# Patient Record
Sex: Male | Born: 1955 | Race: White | Hispanic: No | Marital: Married
Health system: Midwestern US, Academic
[De-identification: ages and names within clinical notes are randomized; demographics above are authoritative.]

---

## 2018-03-11 ENCOUNTER — Encounter: Admit: 2018-03-11 | Discharge: 2018-03-11

## 2018-03-14 ENCOUNTER — Encounter: Admit: 2018-03-14 | Discharge: 2018-03-14

## 2018-03-14 DIAGNOSIS — I10 Essential (primary) hypertension: ICD-10-CM

## 2018-03-14 DIAGNOSIS — M5136 Other intervertebral disc degeneration, lumbar region: ICD-10-CM

## 2018-03-14 DIAGNOSIS — M4800 Spinal stenosis, site unspecified: ICD-10-CM

## 2018-03-14 DIAGNOSIS — T148XXA Other injury of unspecified body region, initial encounter: ICD-10-CM

## 2018-03-14 DIAGNOSIS — R011 Cardiac murmur, unspecified: ICD-10-CM

## 2018-03-14 DIAGNOSIS — M2550 Pain in unspecified joint: Principal | ICD-10-CM

## 2018-03-14 MED ORDER — GABAPENTIN 300 MG PO CAP
300 mg | ORAL_CAPSULE | ORAL | 1 refills | Status: AC
Start: 2018-03-14 — End: 2018-04-30

## 2018-03-15 ENCOUNTER — Ambulatory Visit: Admit: 2018-03-14 | Discharge: 2018-03-15

## 2018-03-15 ENCOUNTER — Encounter: Admit: 2018-03-15 | Discharge: 2018-03-15

## 2018-03-15 DIAGNOSIS — M4802 Spinal stenosis, cervical region: Principal | ICD-10-CM

## 2018-03-15 DIAGNOSIS — M5412 Radiculopathy, cervical region: ICD-10-CM

## 2018-04-04 ENCOUNTER — Encounter: Admit: 2018-04-04 | Discharge: 2018-04-04

## 2018-04-04 ENCOUNTER — Ambulatory Visit: Admit: 2018-04-04 | Discharge: 2018-04-04

## 2018-04-04 ENCOUNTER — Ambulatory Visit: Admit: 2018-04-04 | Discharge: 2018-04-05

## 2018-04-04 DIAGNOSIS — M5136 Other intervertebral disc degeneration, lumbar region: ICD-10-CM

## 2018-04-04 DIAGNOSIS — R011 Cardiac murmur, unspecified: ICD-10-CM

## 2018-04-04 DIAGNOSIS — Z7982 Long term (current) use of aspirin: ICD-10-CM

## 2018-04-04 DIAGNOSIS — M5412 Radiculopathy, cervical region: ICD-10-CM

## 2018-04-04 DIAGNOSIS — F17290 Nicotine dependence, other tobacco product, uncomplicated: ICD-10-CM

## 2018-04-04 DIAGNOSIS — I10 Essential (primary) hypertension: ICD-10-CM

## 2018-04-04 DIAGNOSIS — M4802 Spinal stenosis, cervical region: Principal | ICD-10-CM

## 2018-04-04 DIAGNOSIS — T148XXA Other injury of unspecified body region, initial encounter: ICD-10-CM

## 2018-04-04 DIAGNOSIS — M4800 Spinal stenosis, site unspecified: ICD-10-CM

## 2018-04-04 DIAGNOSIS — R52 Pain, unspecified: Principal | ICD-10-CM

## 2018-04-04 DIAGNOSIS — M2550 Pain in unspecified joint: Principal | ICD-10-CM

## 2018-04-04 MED ORDER — TRIAMCINOLONE ACETONIDE 40 MG/ML IJ SUSP
80 mg | Freq: Once | EPIDURAL | 0 refills | Status: CP
Start: 2018-04-04 — End: ?
  Administered 2018-04-04: 14:00:00 80 mg via EPIDURAL

## 2018-04-04 MED ORDER — IOPAMIDOL 41 % IT SOLN
2.5 mL | Freq: Once | EPIDURAL | 0 refills | Status: CP
Start: 2018-04-04 — End: ?
  Administered 2018-04-04: 14:00:00 2.5 mL via EPIDURAL

## 2018-04-30 ENCOUNTER — Ambulatory Visit: Admit: 2018-04-30 | Discharge: 2018-04-30

## 2018-04-30 ENCOUNTER — Encounter: Admit: 2018-04-30 | Discharge: 2018-04-30

## 2018-04-30 DIAGNOSIS — I10 Essential (primary) hypertension: ICD-10-CM

## 2018-04-30 DIAGNOSIS — R351 Nocturia: ICD-10-CM

## 2018-04-30 DIAGNOSIS — R972 Elevated prostate specific antigen [PSA]: Principal | ICD-10-CM

## 2018-04-30 DIAGNOSIS — M4800 Spinal stenosis, site unspecified: ICD-10-CM

## 2018-04-30 DIAGNOSIS — M5136 Other intervertebral disc degeneration, lumbar region: ICD-10-CM

## 2018-04-30 DIAGNOSIS — T148XXA Other injury of unspecified body region, initial encounter: ICD-10-CM

## 2018-04-30 DIAGNOSIS — M2550 Pain in unspecified joint: Principal | ICD-10-CM

## 2018-04-30 DIAGNOSIS — R011 Cardiac murmur, unspecified: ICD-10-CM

## 2018-04-30 DIAGNOSIS — N401 Enlarged prostate with lower urinary tract symptoms: Secondary | ICD-10-CM

## 2018-04-30 LAB — PROSTATIC SPECIFIC ANTIGEN-PSA: Lab: 2.5 ng/mL (ref <4.01)

## 2018-05-01 ENCOUNTER — Encounter: Admit: 2018-05-01 | Discharge: 2018-05-01

## 2018-05-05 ENCOUNTER — Encounter: Admit: 2018-05-05 | Discharge: 2018-05-05

## 2018-05-05 DIAGNOSIS — N400 Enlarged prostate without lower urinary tract symptoms: ICD-10-CM

## 2018-05-05 DIAGNOSIS — R011 Cardiac murmur, unspecified: Principal | ICD-10-CM

## 2018-05-05 DIAGNOSIS — S0460XA Injury of acoustic nerve, unspecified side, initial encounter: ICD-10-CM

## 2018-05-05 DIAGNOSIS — I10 Essential (primary) hypertension: ICD-10-CM

## 2018-05-05 DIAGNOSIS — M5136 Other intervertebral disc degeneration, lumbar region: ICD-10-CM

## 2018-05-05 DIAGNOSIS — M4800 Spinal stenosis, site unspecified: ICD-10-CM

## 2018-12-07 ENCOUNTER — Encounter: Admit: 2018-12-07 | Discharge: 2018-12-07

## 2018-12-19 ENCOUNTER — Encounter: Admit: 2018-12-19 | Discharge: 2018-12-19

## 2018-12-19 ENCOUNTER — Ambulatory Visit: Admit: 2018-12-19 | Discharge: 2018-12-20

## 2018-12-19 DIAGNOSIS — I10 Essential (primary) hypertension: ICD-10-CM

## 2018-12-19 DIAGNOSIS — M4800 Spinal stenosis, site unspecified: ICD-10-CM

## 2018-12-19 DIAGNOSIS — M5136 Other intervertebral disc degeneration, lumbar region: ICD-10-CM

## 2018-12-19 DIAGNOSIS — N400 Enlarged prostate without lower urinary tract symptoms: ICD-10-CM

## 2018-12-19 DIAGNOSIS — S0460XA Injury of acoustic nerve, unspecified side, initial encounter: ICD-10-CM

## 2018-12-19 DIAGNOSIS — R011 Cardiac murmur, unspecified: Principal | ICD-10-CM

## 2018-12-19 DIAGNOSIS — M5412 Radiculopathy, cervical region: Principal | ICD-10-CM

## 2018-12-19 NOTE — Progress Notes
???   Smokeless tobacco: Former Neurosurgeon   ??? Tobacco comment: Occasional cigar   Substance and Sexual Activity   ??? Alcohol use: Yes     Alcohol/week: 3.0 standard drinks     Types: 3 Cans of beer per week   ??? Drug use: Never   ??? Sexual activity: Yes     Partners: Female     Birth control/protection: Post-menopausal   Other Topics Concern   ??? Not on file   Social History Narrative   ??? Not on file       Allergies   Allergen Reactions   ??? Prednisone RASH         Current Outpatient Medications:   ???  aspirin EC 81 mg tablet, , Disp: , Rfl:   ???  celecoxib (CELEBREX) 200 mg capsule, , Disp: , Rfl:   ???  fluticasone propionate (FLONASE) 50 mcg/actuation nasal spray, , Disp: , Rfl:   ???  omega 3-dha-epa-fish oil (FISH OIL) 100-160-1,000 mg cap, , Disp: , Rfl:     Vitals:    12/19/18 1215   Weight: 93 kg (205 lb)   Height: 188 cm (74)   PainSc: Three       Oswestry Total Score:: 14    Opioid Risk Category: Low Risk (03/14/2018  1:50 PM)    Is a controlled substance agreement on file?No    Pain Score: Three    Body mass index is 26.32 kg/m???.    Review of Systems   Constitutional: Negative.    HENT: Negative.    Eyes: Negative.    Respiratory: Negative.    Cardiovascular: Negative.    Gastrointestinal: Positive for abdominal pain.   Endocrine: Negative.    Genitourinary: Negative.    Musculoskeletal: Positive for back pain, neck pain and neck stiffness.   Skin: Negative.    Allergic/Immunologic: Negative.    Neurological: Negative.    Hematological: Negative.    Psychiatric/Behavioral: Negative.             PHYSICAL EXAM:    Exam limited due to telehealth  Patient demonstrates full neck range of motion but has pain at the extremes of extension, flexion, and bilateral rotation  Patient demonstrates full range of motion of the bilateral shoulders  Patient demonstrates good grip strength with ability to grasp both a pen and a cup of water in both hands  Gait is normal      RADIOGRAPHIC EVALUATION:

## 2018-12-20 DIAGNOSIS — M4802 Spinal stenosis, cervical region: Secondary | ICD-10-CM

## 2019-01-24 ENCOUNTER — Encounter: Admit: 2019-01-24 | Discharge: 2019-01-24

## 2019-01-24 ENCOUNTER — Ambulatory Visit: Admit: 2019-01-24 | Discharge: 2019-01-24

## 2019-01-24 DIAGNOSIS — N400 Enlarged prostate without lower urinary tract symptoms: Secondary | ICD-10-CM

## 2019-01-24 DIAGNOSIS — M4802 Spinal stenosis, cervical region: Secondary | ICD-10-CM

## 2019-01-24 DIAGNOSIS — M4800 Spinal stenosis, site unspecified: Secondary | ICD-10-CM

## 2019-01-24 DIAGNOSIS — M5136 Other intervertebral disc degeneration, lumbar region: Secondary | ICD-10-CM

## 2019-01-24 DIAGNOSIS — R011 Cardiac murmur, unspecified: Secondary | ICD-10-CM

## 2019-01-24 DIAGNOSIS — M5412 Radiculopathy, cervical region: Secondary | ICD-10-CM

## 2019-01-24 DIAGNOSIS — I10 Essential (primary) hypertension: Secondary | ICD-10-CM

## 2019-01-24 DIAGNOSIS — S0460XA Injury of acoustic nerve, unspecified side, initial encounter: Secondary | ICD-10-CM

## 2019-01-24 MED ORDER — TRIAMCINOLONE ACETONIDE 40 MG/ML IJ SUSP
80 mg | Freq: Once | EPIDURAL | 0 refills | Status: CP
Start: 2019-01-24 — End: ?

## 2019-01-24 MED ORDER — IOHEXOL 240 MG IODINE/ML IV SOLN
2.5 mL | Freq: Once | EPIDURAL | 0 refills | Status: CP
Start: 2019-01-24 — End: ?

## 2019-01-24 MED ORDER — LIDOCAINE (PF) 10 MG/ML (1 %) IJ SOLN
5 mL | Freq: Once | INTRAMUSCULAR | 0 refills | Status: CP
Start: 2019-01-24 — End: ?

## 2019-01-24 NOTE — Procedures
[facility] INJECT INTERLAM CRV/THRC  Procedure: epidural - interlaminar    Laterality: n/a    Location: cervical - C7-T1      Consent:   Consent obtained: written  Consent given by: patient  Risks discussed: allergic reaction, bleeding, infection, nerve damage, no change or worsening in pain and reaction to medication    Discussed with patient the purpose of the treatment/procedure, other ways of treating my condition, including no treatment/ procedure and the risks and benefits of the alternatives. Patient has decided to proceed with treatment/procedure.        Universal Protocol:  Relevant documents: relevant documents present and verified  Test results: test results available and properly labeled  Imaging studies: imaging studies available  Required items: required blood products, implants, devices, and special equipment available  Site marked: the operative site was marked  Patient identity confirmed: Patient identify confirmed verbally with patient.        Time out: Immediately prior to procedure a "time out" was called to verify the correct patient, procedure, equipment, support staff and site/side marked as required      Procedures Details:   Indications: pain   Prep: chlorhexidine  Patient position: prone  Estimated Blood Loss: minimal  Specimens: none  Amount Injected:   C7-T1: 2mL    Number of Levels: 1  Approach: midline  Guidance: fluoroscopy  Contrast: Procedure confirmed with contrast under live fluoroscopy.  Needle and Epidural Catheter: tuohy  Needle size: 22 G  Injection procedure: Negative aspiration for blood  Patient tolerance: Patient tolerated the procedure well with no immediate complications. Pressure was applied, and hemostasis was accomplished.  Comments: 80mg triamcinolone injected after contrast epidurogram

## 2019-01-24 NOTE — Discharge Instructions - Supplementary Instructions
GENERAL POST PROCEDURE INSTRUCTIONS    Time:____________________  Physician:_________________________________    Procedure Completed Today:  o Joint Injection (hip, knee, shoulder)  o Cervical Epidural Steroid Injection  o Cervical Transforaminal Steroid Injection  o Trigger Point Injection  o Other___________________________ o Thoracic Epidural Steroid Injection  o Lumbar Epidural Steroid Injection  o Lumbar Transforaminal Steroid Injection  o Facet Joint Injection     Important information following your procedure today:  ? You may drive today     ? If you had sedation ,you may NOT drive today  ? Rest at home for the next 6 hours.  You may then begin to resume your normal activities.  ? DO NOT drive any vehicle, operate any power tools, drink alcohol, make any major decisions, or sign any legal documents for the next 12 hours.  1. Pain relief may not be immediate. It is possible you may even experience an increase in pain during the first 24-48 hours followed by a gradual decrease of your pain.  2. Though the procedure is generally safe and complications are rare, we do ask that you be aware of any of the following:  ? Any swelling, persistent redness, new bleeding or drainage from the site of the injection.  ? You should not experience a severe headache.  ? You should not run a fever over 101oF.  ? New onset of sharp, severe back and or neck pain.  ? New onset of upper or lower extremity numbness or weakness.  ? New difficulty controlling bowel or bladder function after injection.  ? New shortness of breath.  If any of these occur, please call to report this occurrence to a nurse at [phone]. If you are calling after 4:00pm, on the weekends or holidays please call [phone] and ask to have the pain management resident physician on call for the physician paged or go to your local emergency room.   3. You may experience soreness at the injection site. Ice can be applied at 20 minute intervals for the first 24 hours. The following day you may alternate ice with heat if you are experiencing muscle tightness, otherwise continue with ice. Ice works best at decreasing pain. Avoid application of direct heat, hot showers or hot tubs today.  4. Avoid strenuous activity today. You many resume your regular activities and exercise tomorrow.  5. Patients with diabetes may see an elevation in blood sugars for 7-10 days after the injection. It is important to pay close attention to you diet, check your blood sugars daily and repost extreme elevations to the physician that treats your diabetes.  6. Patients taking daily blood thinners can resume their regular dose this evening.  7. It is important that you take all medications ordered by your pain physician. Taking medications as ordered is an important part of you pain care plan. If you cannot continue the medication plan, please notify the physician.  Possible side effects to steroids that may occur:  ? Flushing or redness of the face  ? Irritability  ? Fluid retention  ? Change in women's menses  Follow up appointment as needed if in the event you are unable to keep an appointment please notify the scheduler 24 hours in advance at [phone].    ____________________________________  ____________________________________

## 2019-01-24 NOTE — Progress Notes
SPINE CENTER HISTORY AND PHYSICAL    No chief complaint on file.           SUBJECTIVE:      Obtained patient's verbal consent to treat them and their agreement to [facility] financial policy and NPP via this video telehealth visit during the [facility] Emergency.    Patient physical location: Home [facility])  Dr. Larene Beach physical location: Work office     Travis Blackburn returns today for increasing pain in the bilateral neck, shoulders, and shoulder blades.  His pain started about 6 to 8 weeks ago with no inciting event.  He describes it as a constant aching, tight pain.  Sometimes it is shooting.  His average pain level is 6 or 7 out of 10.  It is present at rest but increases with activity.  He denies any numbness, tingling, or weakness.  He has managed to maintain a high activity level which includes trimming trees and grinding tree stumps.  He has completed physical therapy for neck pain in the past year and has continued daily exercises for cervical stabilization.  He has been taking Celebrex 200 mg daily which has not helped.  He uses a topical cream that helps somewhat when pain is severe.  He has previously had significant benefit with gabapentin but this also caused significant somnolence so he has not wanted to take this again.  In August 2019 he underwent an epidural steroid injection at C7-T1 for similar symptoms.  This improved pain by 90 to 95% for about 6 months.  He did not require any medications or activity limitations during this period of time and was happy with the results.          Initial HPI 03/15/18:    Mr. Fonder presents today for evaluation of neck pain.  He state that it has been going on for roughly 3 months.  He describes it like wearing a heavy rutsack.  He states that he has disomfort and pain on both sides of his neck.  Pain mostly on his right arm but decreased strength and numbness on the left side.  It feels like when you hit the funny bone.  The frequency and intensity has improved recently.  He notes discomfort with turning his head to the left.  The pain radiates down the left arm.  It's more aching than sharp in quality.  The other day, he noted some spasm-ing of the left arm when driving back from [place].  He takes celebrex for his back pain and knee pain.  He takes ASA daily.  But he has not taken anything specifically for this pain.  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  Normally the pain is 4/10.  He notes that bench pressing makes the pain worse.  Aside from this, he denies any cardiac chest pain, shortness of breath, or abdominal pain.           Medical History:   Diagnosis Date   ??? BPH (benign prostatic hyperplasia)    ??? Degenerative disc disease, lumbar 2005   ??? Heart murmur 1976    Functional   ??? Hypertension, essential 2000   ??? Spinal stenosis 2019   ??? Vestibular nerve damage, left 1996       Surgical History:   Procedure Laterality Date   ??? KNEE CARTILAGE SURGERY  1990   ??? KNEE SURGERY  1998   ???  KNEE CARTILAGE SURGERY  2005   ??? TURBINATE RESECTION Bilateral 2012   ??? HAND SURGERY Left 2013    took tendon from right forearm, repaired thumb   ??? KNEE REPLACEMENT Right 02/2015    Partial TKA       family history includes Alcohol liver disease in his father; Cancer in his sister; Cancer-Breast in his sister and sister; Cancer-Colon in his brother; Cancer-Ovarian in his sister; Hypertension in his father and mother; Stroke in his father.    Social History     Socioeconomic History   ??? Marital status: Married     Spouse name: Not on file   ??? Number of children: Not on file   ??? Years of education: Not on file   ??? Highest education level: Not on file   Occupational History   ??? Not on file   Tobacco Use   ??? Smoking status: Current Some Day Smoker     Packs/day: 0.25     Years: 15.00     Pack years: 3.75     Types: Cigars   ??? Smokeless tobacco: Former Neurosurgeon ??? Tobacco comment: Occasional cigar   Substance and Sexual Activity   ??? Alcohol use: Yes     Alcohol/week: 3.0 standard drinks     Types: 3 Cans of beer per week   ??? Drug use: Never   ??? Sexual activity: Yes     Partners: Female     Birth control/protection: Post-menopausal   Other Topics Concern   ??? Not on file   Social History Narrative   ??? Not on file       Allergies   Allergen Reactions   ??? Prednisone RASH         Current Outpatient Medications:   ???  aspirin EC 81 mg tablet, , Disp: , Rfl:   ???  celecoxib (CELEBREX) 200 mg capsule, , Disp: , Rfl:   ???  fluticasone propionate (FLONASE) 50 mcg/actuation nasal spray, , Disp: , Rfl:   ???  omega 3-dha-epa-fish oil (FISH OIL) 100-160-1,000 mg cap, , Disp: , Rfl:     Current Facility-Administered Medications:   ???  iohexoL (OMNIPAQUE-240) 240 mg/mL injection 2.5 mL, 2.5 mL, Epidural, ONCE, Philomena Course, MD  ???  lidocaine PF 1% (10 mg/mL) injection 5 mL, 5 mL, Injection, ONCE, Philomena Course, MD  ???  triamcinolone acetonide (KENALOG-40) injection 80 mg, 80 mg, Epidural, ONCE, Philomena Course, MD    Vitals:    01/24/19 1343 01/24/19 1437   BP:  (!) 160/92   BP Source:  Arm, Right Upper   Temp: 36.6 ???C (97.9 ???F)    SpO2:  99%   Weight:  93 kg (205 lb)   Height:  188 cm (74)            Opioid Risk Category: Low Risk (03/14/2018  1:50 PM)    Is a controlled substance agreement on file?No         Body mass index is 26.32 kg/m???.    Review of Systems   Constitutional: Negative.    HENT: Negative.    Eyes: Negative.    Respiratory: Negative.    Cardiovascular: Negative.    Gastrointestinal: Positive for abdominal pain.   Endocrine: Negative.    Genitourinary: Negative.    Musculoskeletal: Positive for back pain, neck pain and neck stiffness.   Skin: Negative.    Allergic/Immunologic: Negative.    Neurological: Negative.    Hematological: Negative.  Psychiatric/Behavioral: Negative.             PHYSICAL EXAM:    Exam limited due to telehealth Patient demonstrates full neck range of motion but has pain at the extremes of extension, flexion, and bilateral rotation  Patient demonstrates full range of motion of the bilateral shoulders  Patient demonstrates good grip strength with ability to grasp both a pen and a cup of water in both hands  Gait is normal      RADIOGRAPHIC EVALUATION:    01/23/18 O[facility] C Spine MRI per radiologist interpretation:  -  Mild C6-7 spinal stenosis  -  Moderate to severe C6-7 right NF stenosis    By my interpretation, NF stenosis worse on C6-7 on the left.     IMPRESSION:    1. Cervical radiculopathy    2. Cervical stenosis of spine          PLAN:    C7-T1 epidural steroid injection

## 2019-08-23 ENCOUNTER — Encounter: Admit: 2019-08-23 | Discharge: 2019-08-23

## 2019-09-02 ENCOUNTER — Encounter: Admit: 2019-09-02 | Discharge: 2019-09-02

## 2019-09-02 ENCOUNTER — Ambulatory Visit: Admit: 2019-09-02 | Discharge: 2019-09-02

## 2019-09-02 DIAGNOSIS — I10 Essential (primary) hypertension: Secondary | ICD-10-CM

## 2019-09-02 DIAGNOSIS — S0460XA Injury of acoustic nerve, unspecified side, initial encounter: Secondary | ICD-10-CM

## 2019-09-02 DIAGNOSIS — M67911 Unspecified disorder of synovium and tendon, right shoulder: Secondary | ICD-10-CM

## 2019-09-02 DIAGNOSIS — M7918 Myalgia, other site: Secondary | ICD-10-CM

## 2019-09-02 DIAGNOSIS — M4800 Spinal stenosis, site unspecified: Secondary | ICD-10-CM

## 2019-09-02 DIAGNOSIS — M5136 Other intervertebral disc degeneration, lumbar region: Secondary | ICD-10-CM

## 2019-09-02 DIAGNOSIS — N400 Enlarged prostate without lower urinary tract symptoms: Secondary | ICD-10-CM

## 2019-09-02 DIAGNOSIS — R011 Cardiac murmur, unspecified: Secondary | ICD-10-CM

## 2019-09-02 MED ORDER — TIZANIDINE 2 MG PO TAB
2-4 mg | ORAL_TABLET | Freq: Every evening | ORAL | 0 refills | Status: AC | PRN
Start: 2019-09-02 — End: ?

## 2019-09-02 NOTE — Progress Notes
ATTESTATION    I personally performed the key portions of the E/M visit, discussed case with the fellow and concur with documentation of history, physical exam, assessment, and treatment plan unless otherwise noted.    Staff name:  Merced Hanners M Briunna Leicht, MD Date:  09/02/2019

## 2019-09-02 NOTE — Patient Instructions
It was nice to see you today. Thank you for choosing to visit our clinic.       Your time is important and if you had to wait today, we do apologize. Our goal is to run exactly on time; however, on occasion, we get behind in clinic due to unexpected patient issues. Thank you for your patience.    General Instructions:   How to reach me: Please send a MyChart message to the Spine Center or leave a voicemail for my nurse, Melissa, at [phone]   How to get a medication refill: Please use the MyChart Refill request or contact your pharmacy directly to request medication refills. We do not do same day refills on controlled substances.   How to receive your test results: If you have signed up for MyChart, you will receive your test results and messages from me this way. Otherwise, you will get a phone call or letter. If you are expecting results and have not heard from my office within 2 weeks of your testing, please send a MyChart message or call my office.   Scheduling: Our scheduling phone number is [phone].   Appointment Reminders on your cell phone: Make sure we have your cell phone number, and Text [place] to [phone].   Support for many chronic illnesses is available through [facility]: [facility] or [phone].   For questions on nights, weekends or holidays, call the Operator at [phone], and ask for the doctor on call for Anesthesia Pain.      Again, thank you for coming in today.    _________________________________________________________________________________________________________

## 2019-09-02 NOTE — Progress Notes
SPINE CENTER CLINIC NOTE       SUBJECTIVE:   64 yo with a hx of cervical radiculopathy who presents for 6 month routine follow-up after CESI. Patient had C7-T1 interlaminar ESI (6/'20) for left sided cervical radiculopathy. He reports complete resolution of LUE pain and numbness/tingling. His primary area of complaint involves right sided neck and shoulder pain. He reports started about 6 months ago. Gradual onset. No inciting trauma or injury. He notes that he has been doing more computer work at home. Describes as aching and sharp. Localizes over right lateral neck pain extending to approx mastoid process and right upper trapezius region. Constant. Aggravated by turing neck movement toward right side. Reports alleviation with certain neck movements, turning toward left and downward. Denies any numbness/tingling or weakness. He has not done any formal PT for this. Currently he is taking Celebrex 200 mg daily.          Review of Systems   Musculoskeletal: Positive for myalgias, neck pain and neck stiffness.   All other systems reviewed and are negative.      Current Outpatient Medications:   ?  aspirin EC 81 mg tablet, , Disp: , Rfl:   ?  celecoxib (CELEBREX) 200 mg capsule, , Disp: , Rfl:   ?  fluticasone propionate (FLONASE) 50 mcg/actuation nasal spray, , Disp: , Rfl:   ?  omega 3-dha-epa-fish oil (FISH OIL) 100-160-1,000 mg cap, , Disp: , Rfl:   Allergies   Allergen Reactions   ? Prednisone RASH     Physical Exam  Vitals:    09/02/19 1038 09/02/19 1040   BP: (!) 174/104 (!) 185/109   BP Source:  Arm, Left Upper   Patient Position:  Sitting   Pulse: 63    Resp: 14    Temp: 36.7 ?C (98.1 ?F)    SpO2: 99%    Weight: 93 kg (205 lb)    Height: 177.8 cm (70)    PainSc: Three         Pain Score: Three  Body mass index is 29.41 kg/m?.  Gen: NAD   HEENT: NC/AT  Back: Cervical: No TTP over midline or paraspinal region, increased right paraspinal muscle tone, negative Sperling bilaterally MSK: TTP and increased tone over right splenius muscles and upper trapezius with palpable nodule, positive R empty can test, negative R Hawkin/Neer/Yergason tests, full active ROM of right shoulder, no TTP over R AC/GH joints   Extrem: BUE: No sensory deficits to fine touch, 5/5 strength throughout, negative Hoffman bilaterally  CV: RRR   Resp: Non-labored respirations  GI: S/NT/ND  Back: Lumbar: No TTP over midline or paraspinal region            IMPRESSION:  1. Myofascial pain dysfunction syndrome    2. Rotator cuff dysfunction, right        PLAN:    -Discussed ergonomic changes to make while working at home on computer (I.e. screen at eye level, desk at appropriate height, etc)   -External referral for PT provided with focus on myofascial release and scapular stabilization techniques   -Start Tizanidine 2-4 mg QHS PRN; Continue rest of current medication regimen: Celebrex 200 mg daily, Voltaren 1% gel QID PRN   -If limited improvement with the above interventions, consider TPIs of right splenius and upper trapezius musculature     Rosilyn Mings MD PGY-5  Interventional Pain Fellow

## 2019-09-11 ENCOUNTER — Encounter: Admit: 2019-09-11 | Discharge: 2019-09-11

## 2021-02-02 IMAGING — CR SHOULDCMLT
3 series · 3 of 3 positions shown · non-contrast
Comparison: none

[shoulder external]
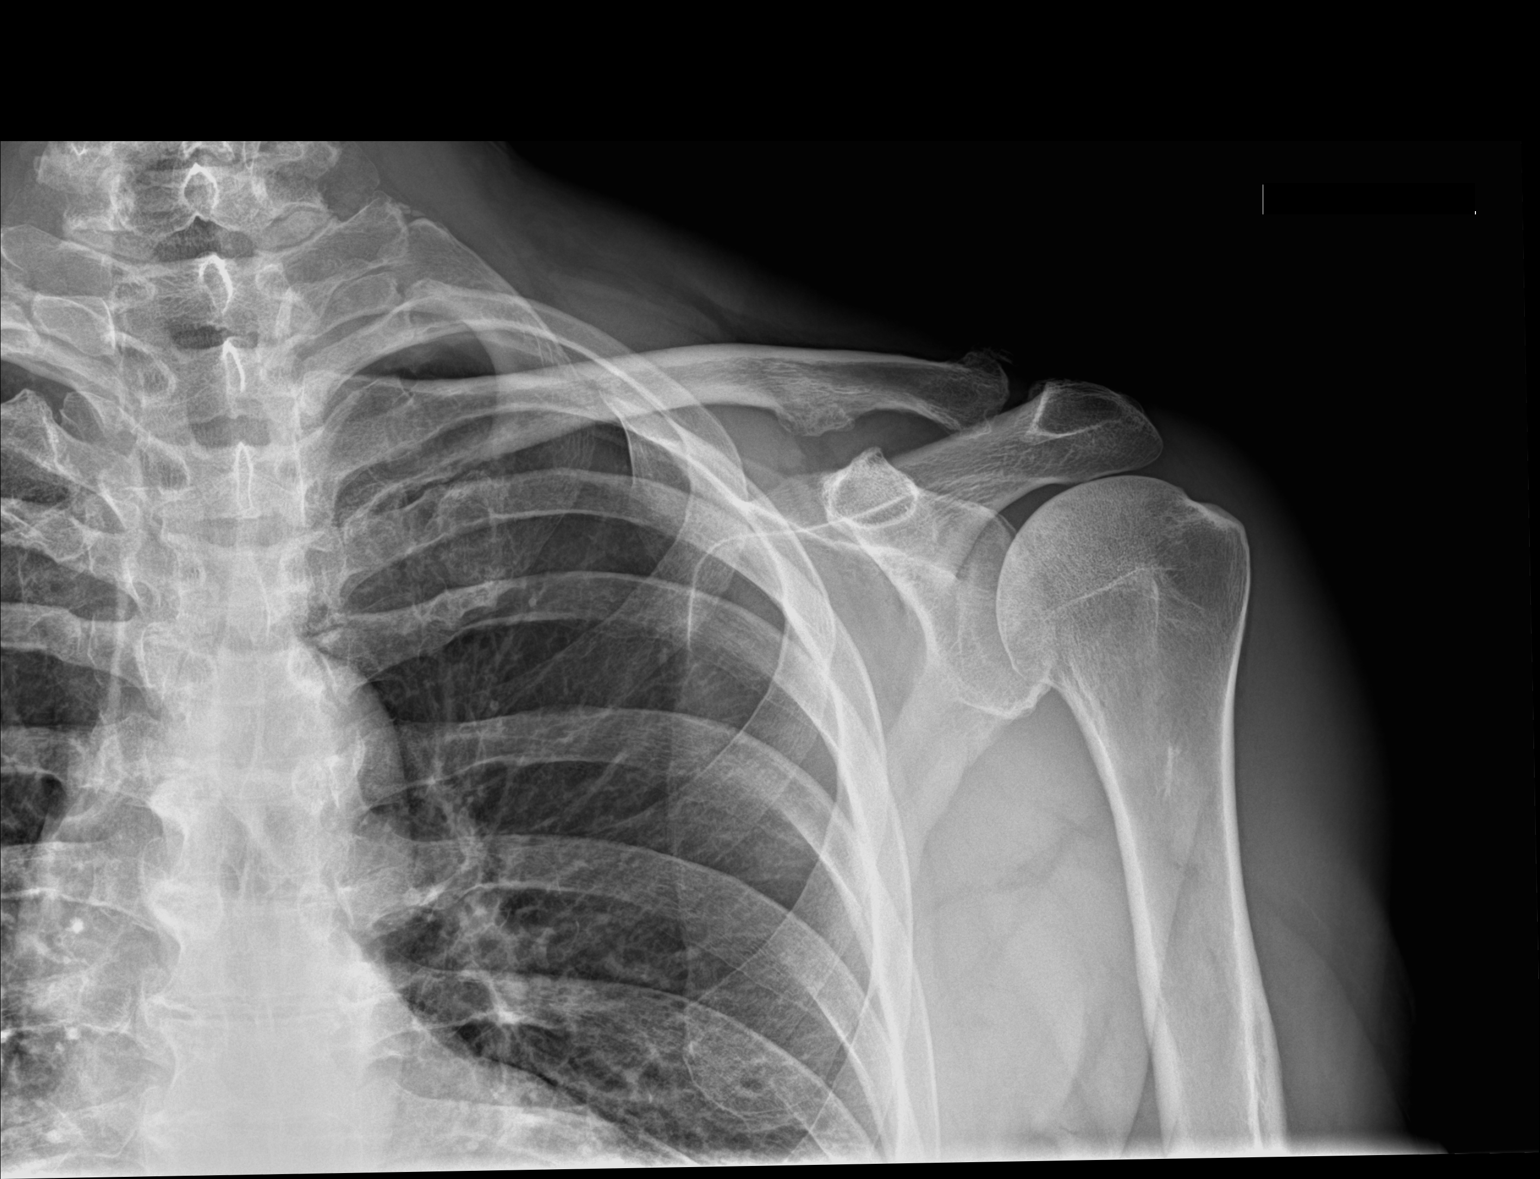

[shoulder internal]
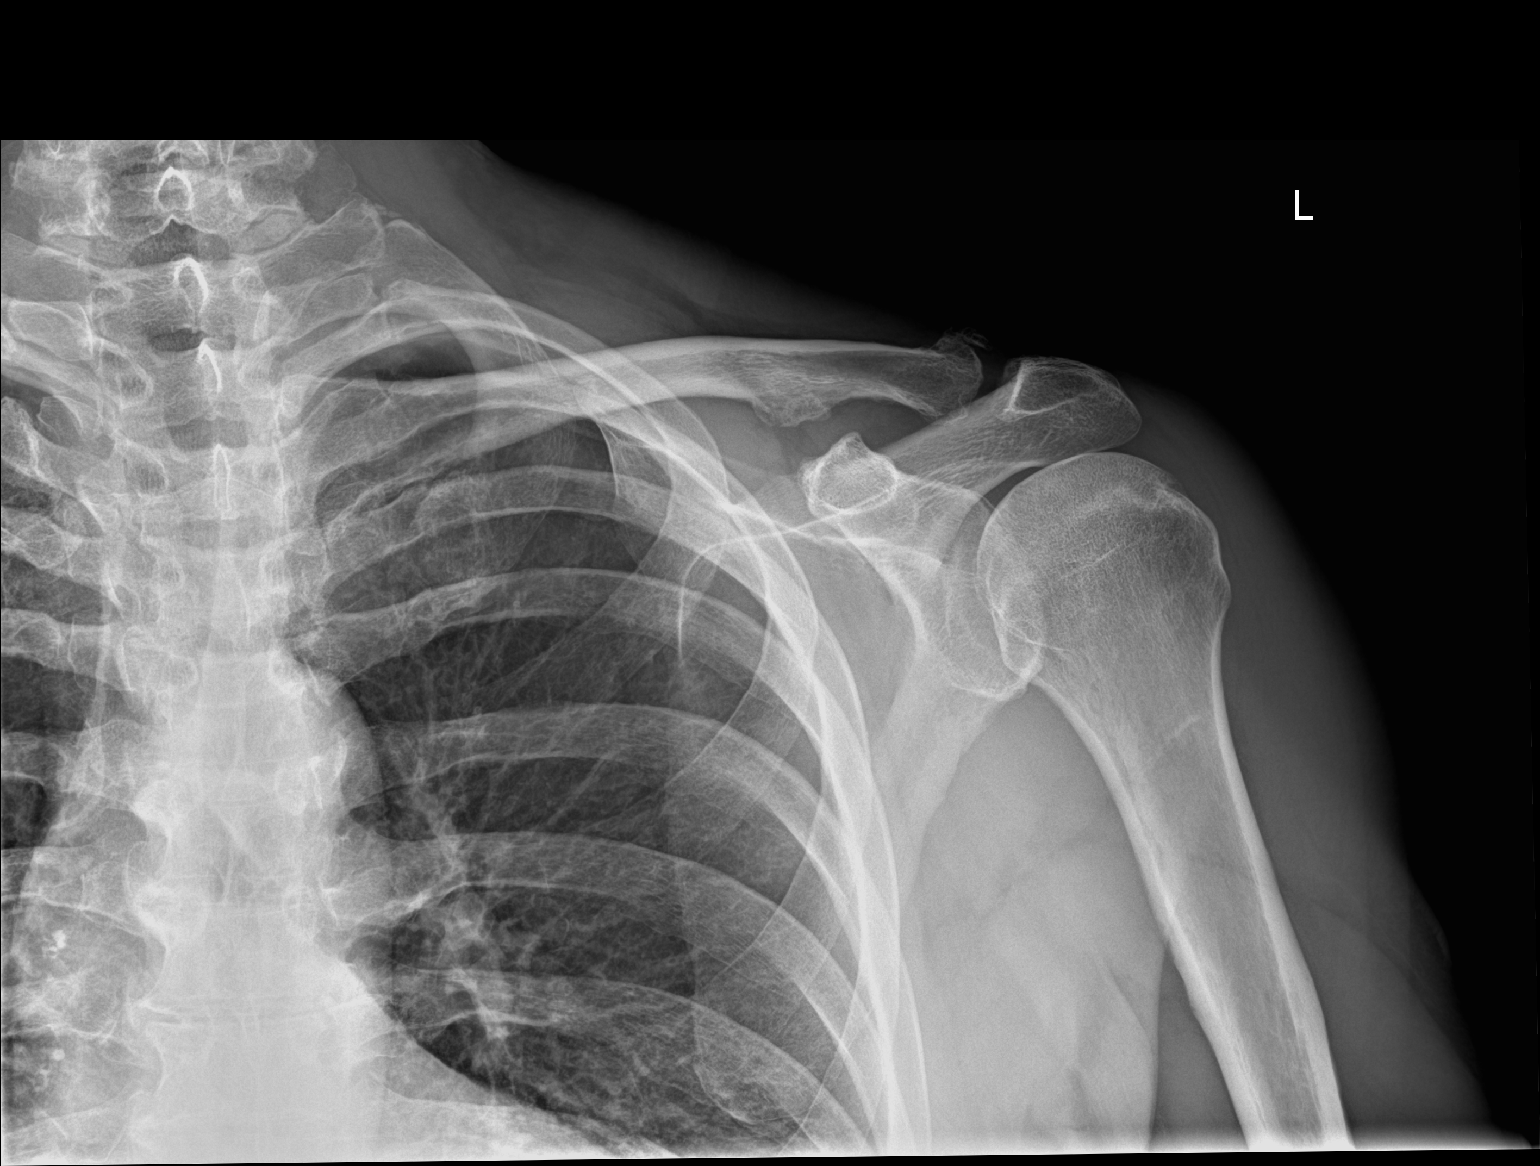

[shoulder y-view]
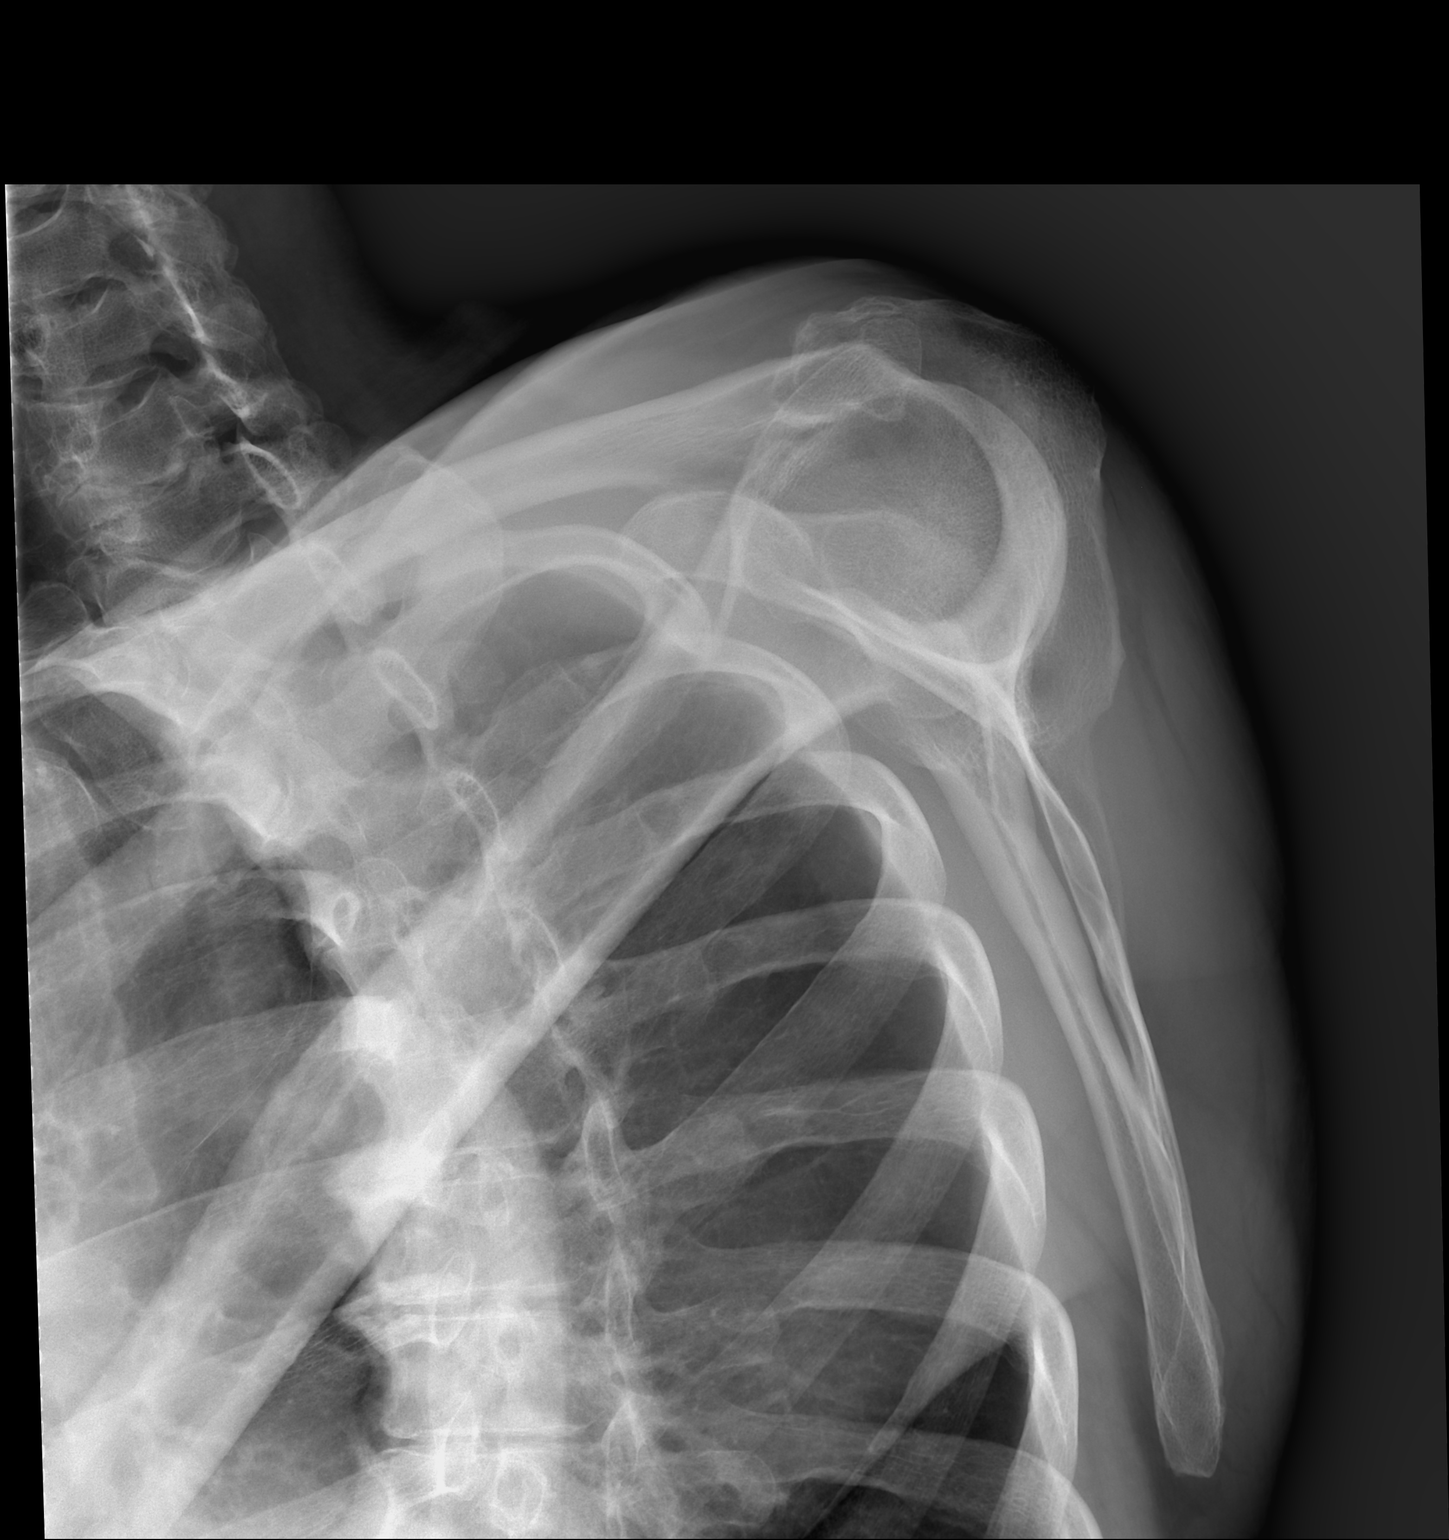

[3 of 3 positions shown; findings below may reference images not displayed]

EXAM

RADIOLOGICAL EXAMINATION, SHOULDER

INDICATION

pain
LEFT SHOULDER PAIN. TM

COMPARISONS

None

FINDINGS

The bone density is maintained. There is no destruction.

The glenohumeral joint is intact. There is no dislocation.

The AC joint is intact on these nonstress views. Degenerative changes of the AC joint are noted.

There is no displaced fracture.

IMPRESSION

There is no fracture or dislocation. Mild AC joint degenerative changes are present.

Tech Notes:

LEFT SHOULDER PAIN. TM

## 2021-02-23 IMAGING — MR Shoulder^ROUTINE
3 series · 22 of 38 positions shown · non-contrast
Comparison: none

[Series 6: STIR · sagittal · 4.0mm · 0.27mm/px · 5 of 5 slices shown (1 of 2)]
[im 1/5]
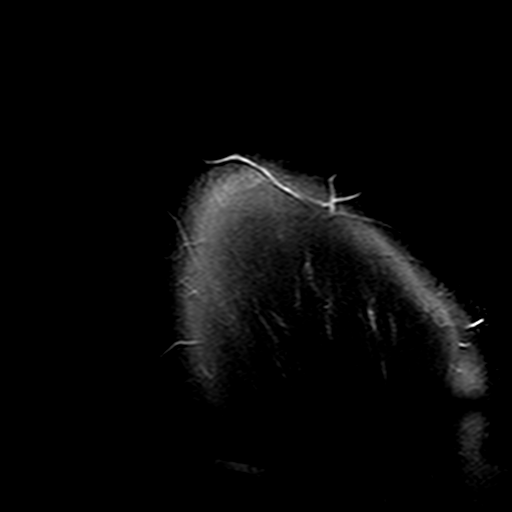
[im 2/5]
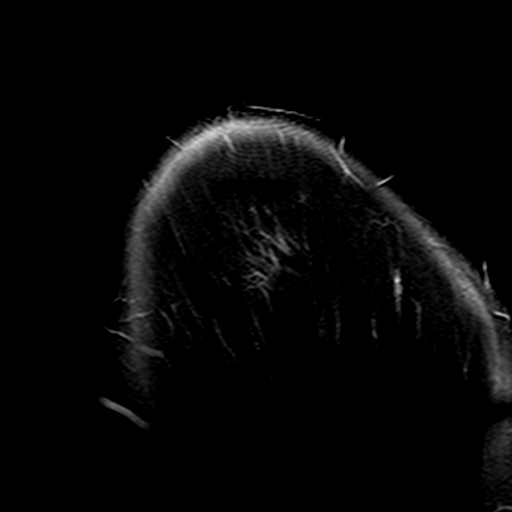
[im 3/5]
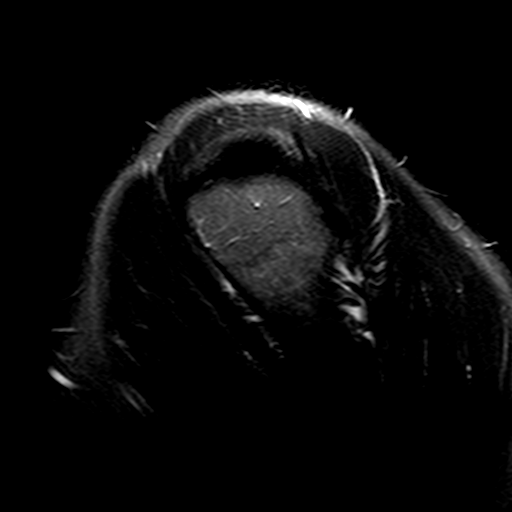
[im 4/5]
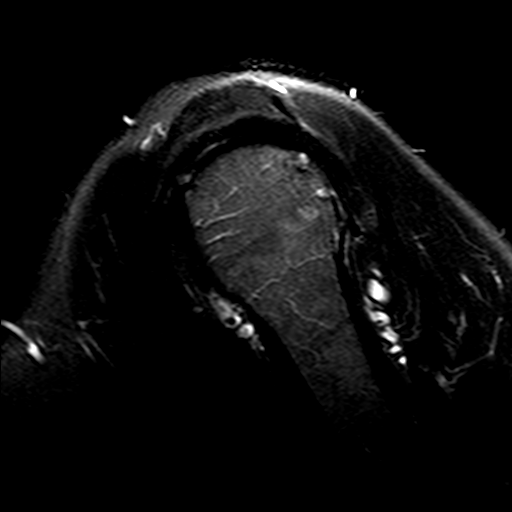
[im 5/5]
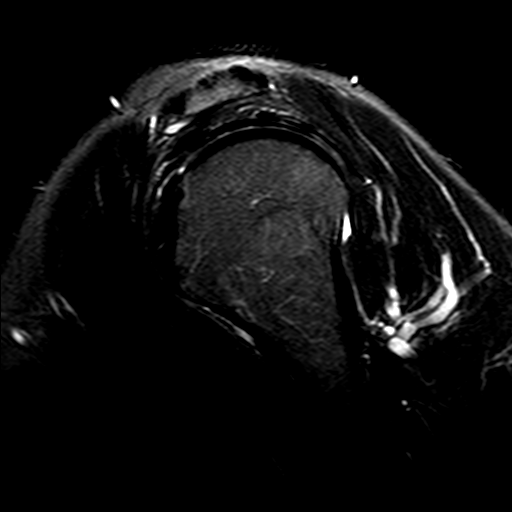

[Series 7: T2 · oblique · 4.0mm · 0.36mm/px · 10 of 16 slices shown]
[im 2/16]
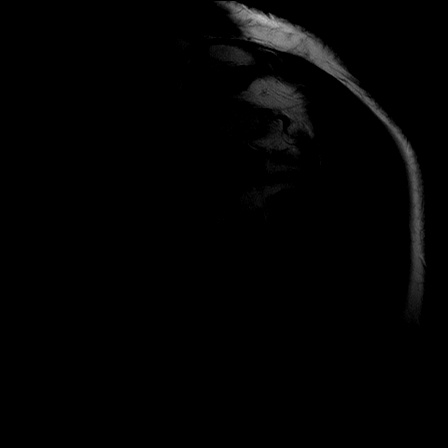
[im 3/16]
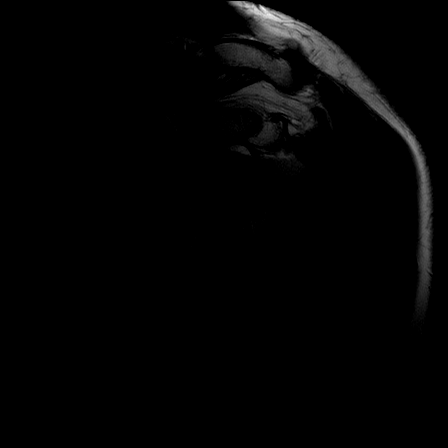
[im 4/16]
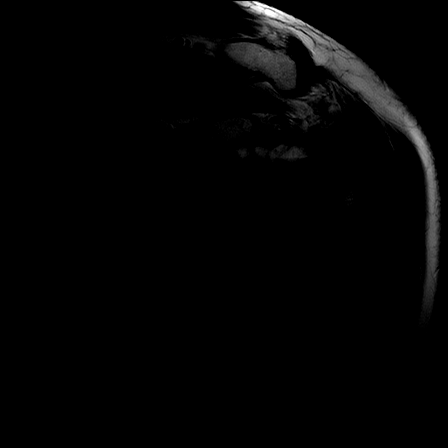
[im 6/16]
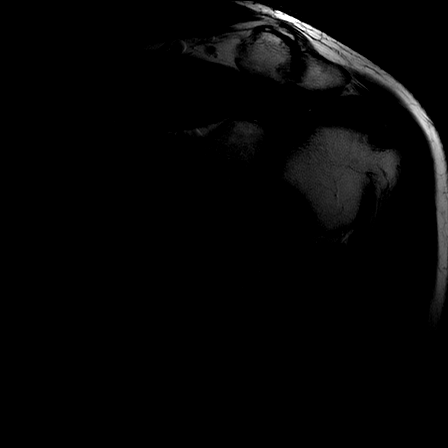
[im 8/16]
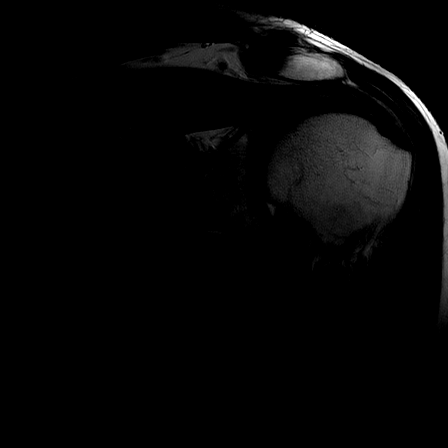
[im 9/16]
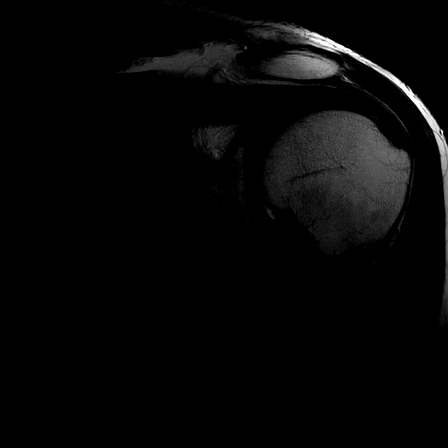
[im 10/16]
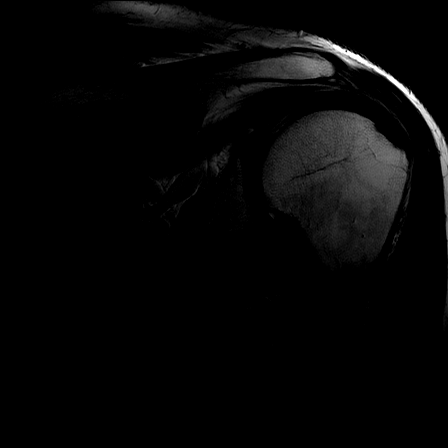
[im 12/16]
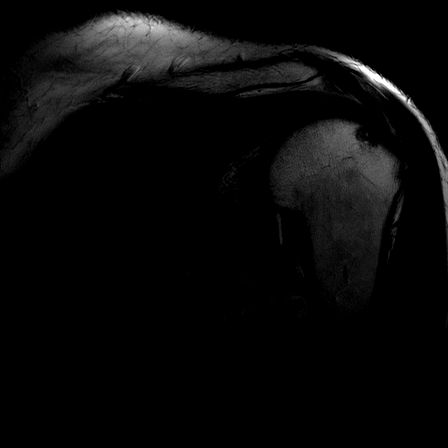
[im 14/16]
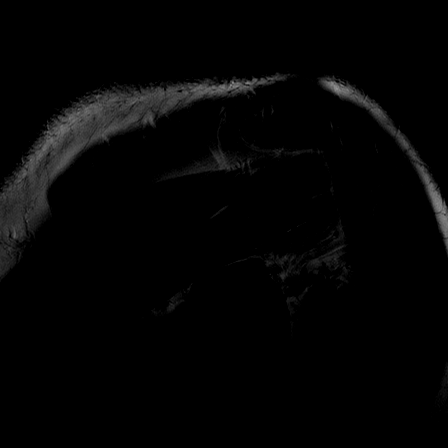
[im 16/16]
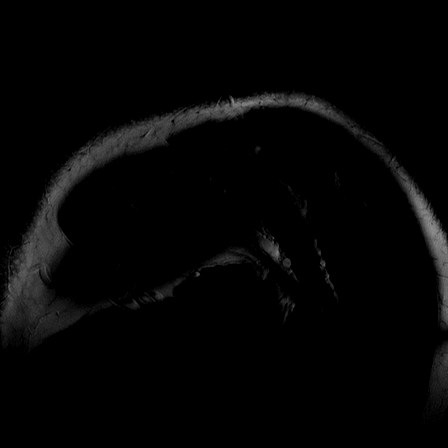

[Series 8: STIR · oblique · 4.0mm · 0.27mm/px · 7 of 17 slices shown (2 of 2)]
[im 2/17]
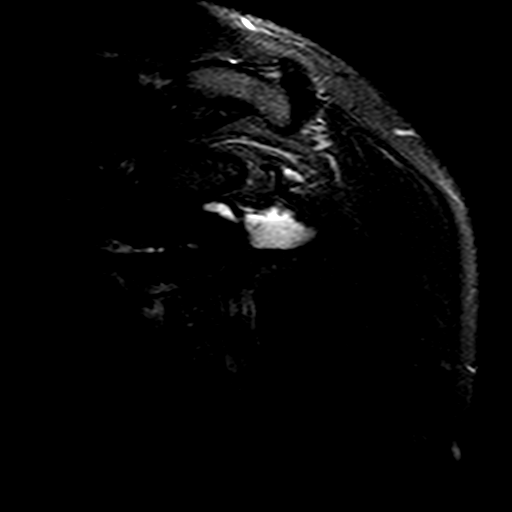
[im 3/17]
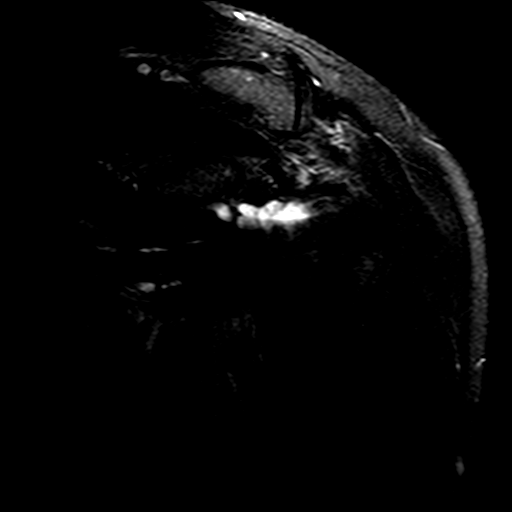
[im 4/17]
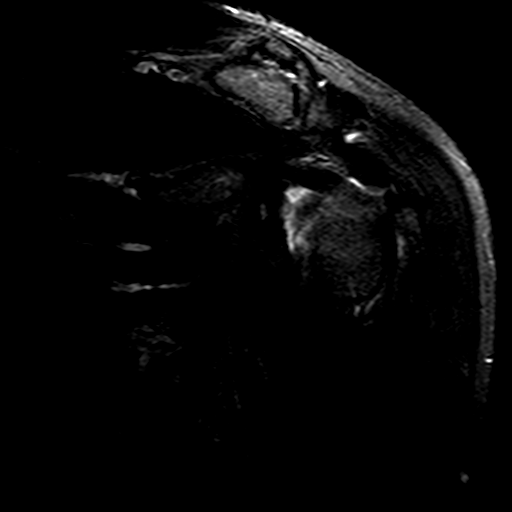
[im 6/17]
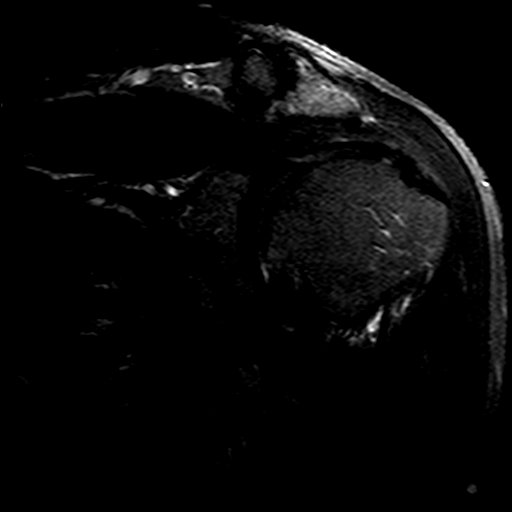
[im 8/17]
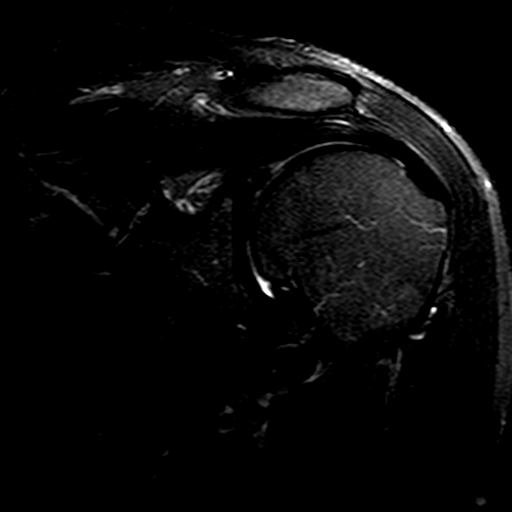
[im 9/17]
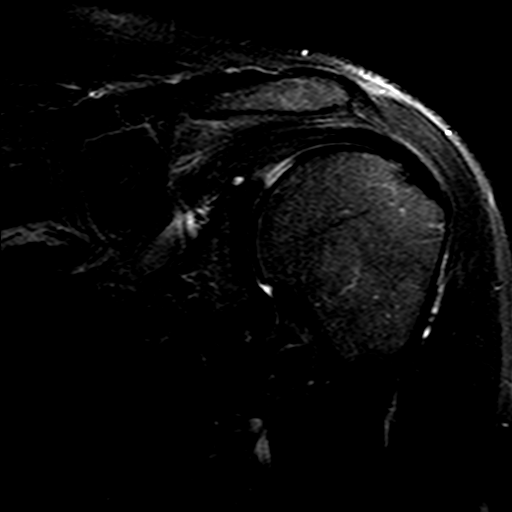
[im 15/17]
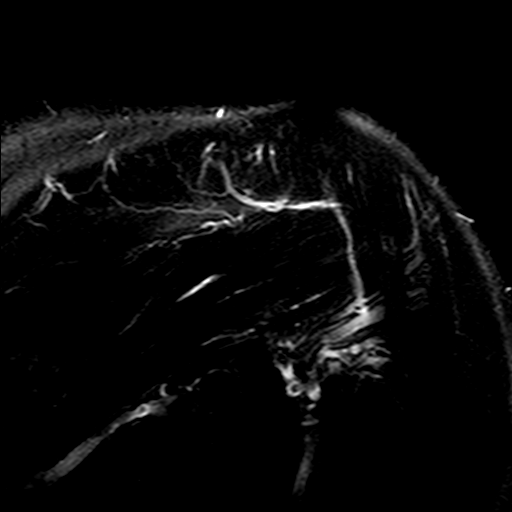

[22 of 38 positions shown; findings below may reference images not displayed]

EXAM

MR shoulder LT wo con

INDICATION

Left shoulder pain, limited range of motion

TECHNIQUE

MR of the left shoulder was performed without intravenous contrast. Multiplanar, multisequence
imaging was performed.

COMPARISONS

XR left shoulder 02/02/2021

FINDINGS

ROTATOR CUFF: Intact supraspinatus and infraspinatus tendons. Intact subscapularis tendon. Intact
teres minor tendon.

BICEPS TENDON: Intact extra-articular and intra-articular segments of the long head of the biceps
tendon.

GLENOHUMERAL JOINT: No measurable cartilage defect. No labral tear. Trace fluid in the glenohumeral
joint. No intra-articular body. Mild thickening of the axillary pouch measuring greater than 4
millimeters at the anterior inferior glenohumeral ligament, which may be compatible with adhesive
capsulitis.

ACROMIOCLAVICULAR JOINT/SPACE:  Mild degenerative change of the acromioclavicular joint. Type II
acromion. Intact coracoclavicular and coracoacromial ligaments. No subacromial/subdeltoid bursal
distention.

BONE: Normal.

MUSCLES: Normal muscle bulk of the rotator cuff.

NEUROVASCULAR: Normal.

IMPRESSION
1. Consideration for adhesive capsulitis.
2. Mild degenerative change of the acromioclavicular joint.
3. No rotator cuff tear.

Tech Notes:

PAIN TO LEFT SHOULDER AND LEFT ARM, LIMITED ROM, UNABLE TO LIFT.  RG

## 2021-02-23 IMAGING — MR C-spine^Routine
4 series · 39 of 48 positions shown · non-contrast
Comparison: none

[Series 2: T2 · sagittal · 3.0mm · 0.54mm/px · 9 of 13 slices shown]
[im 1/13]
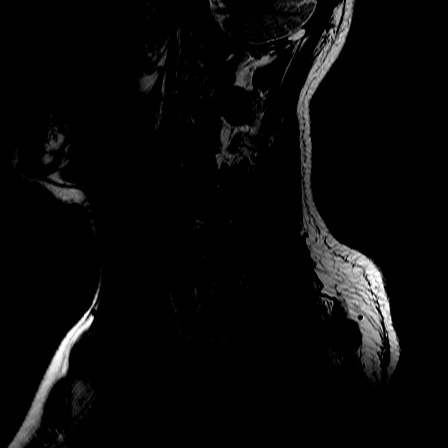
[im 2/13]
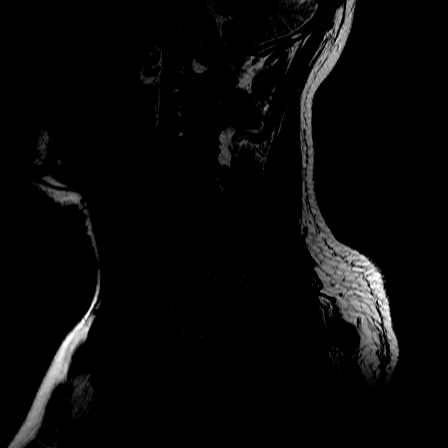
[im 4/13]
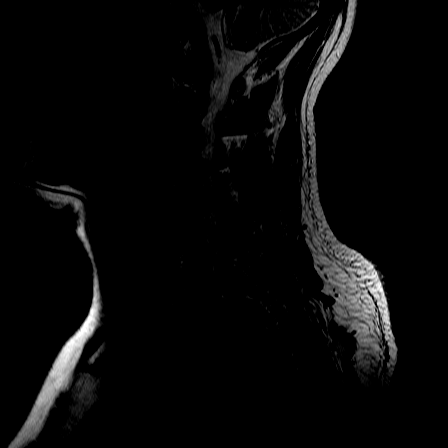
[im 5/13]
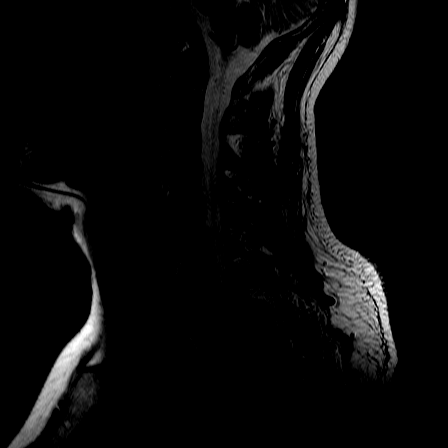
[im 7/13]
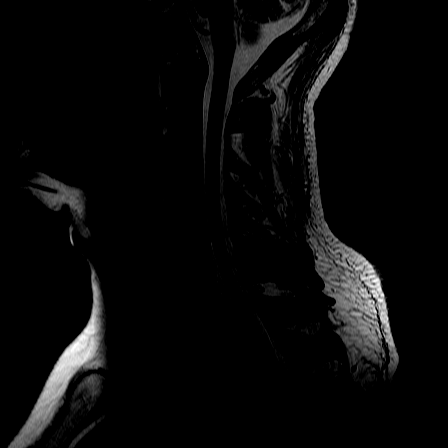
[im 8/13]
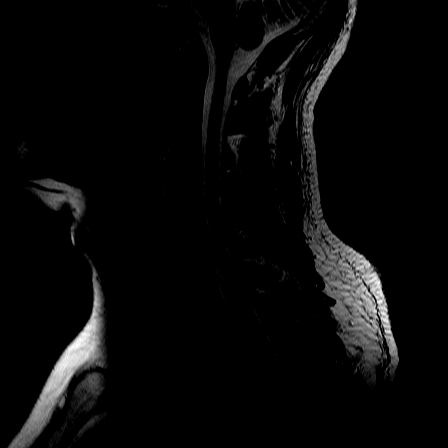
[im 10/13]
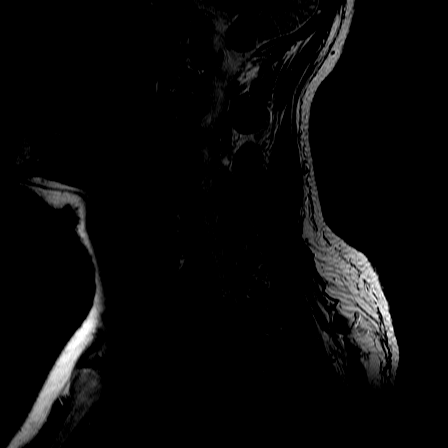
[im 11/13]
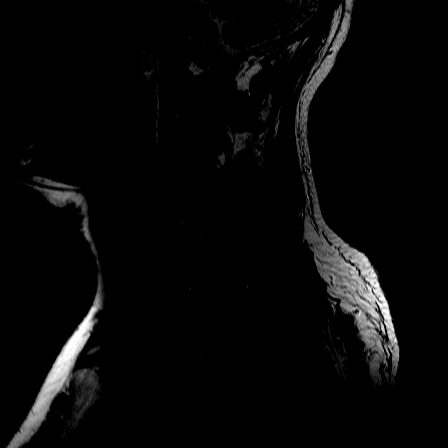
[im 13/13]
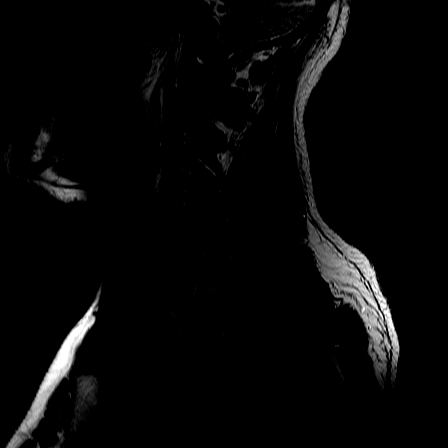

[Series 3: T1 · sagittal · 3.0mm · 0.75mm/px · 9 of 13 slices shown (1 of 2)]
[im 1/13]
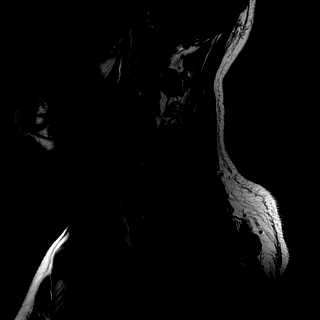
[im 2/13]
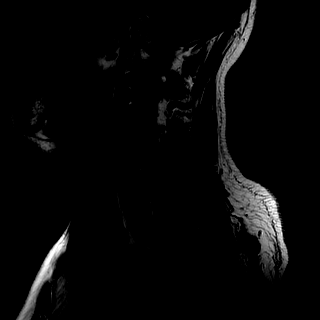
[im 4/13]
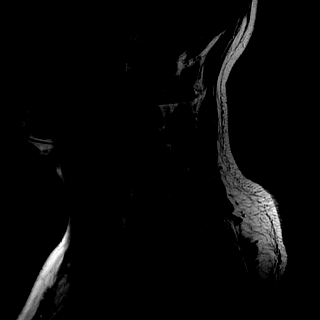
[im 5/13]
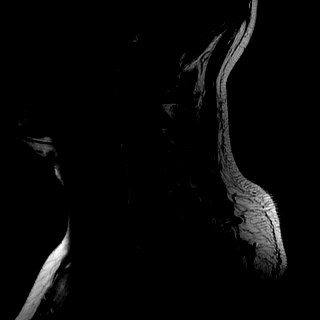
[im 7/13]
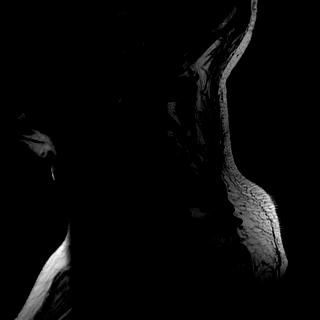
[im 8/13]
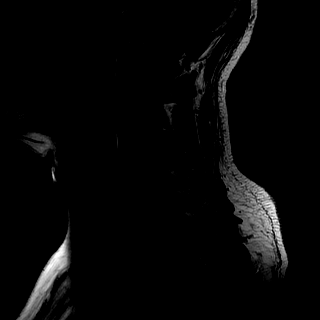
[im 10/13]
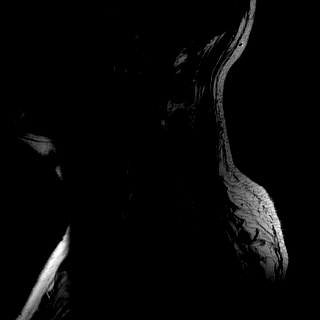
[im 11/13]
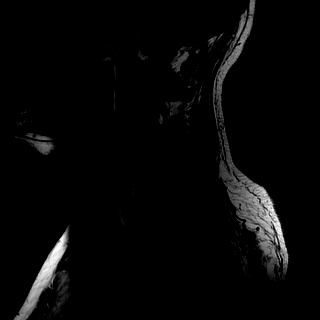
[im 13/13]
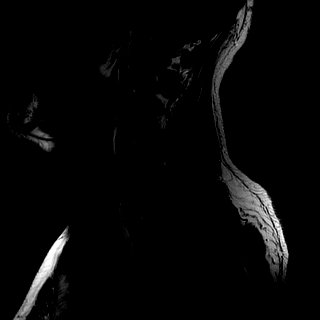

[Series 5: provider echo axial · axial · 3.0mm · 0.39mm/px · z∈[-17,+73]mm · 8 of 19 slices shown]
[im 1/19]
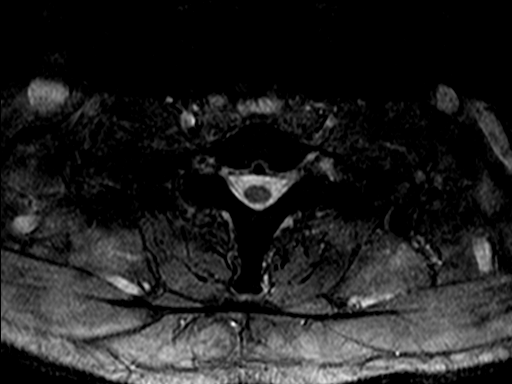
[im 3/19]
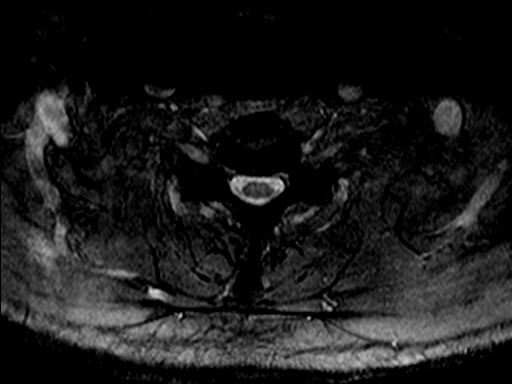
[im 6/19]
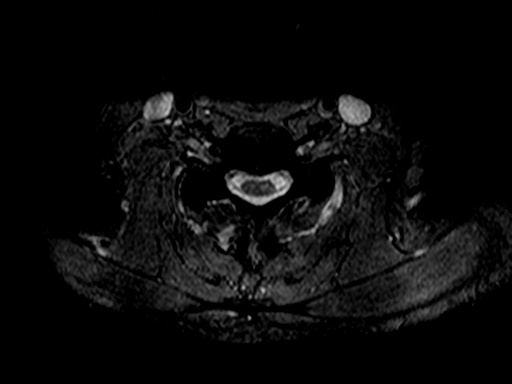
[im 9/19]
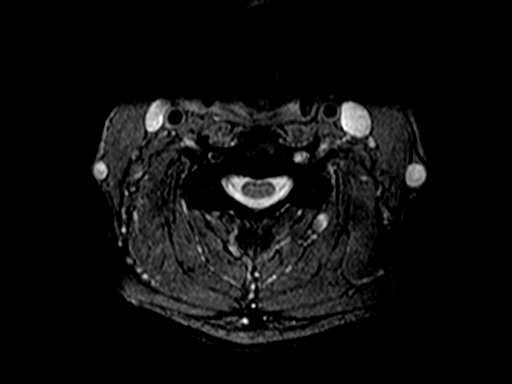
[im 10/19]
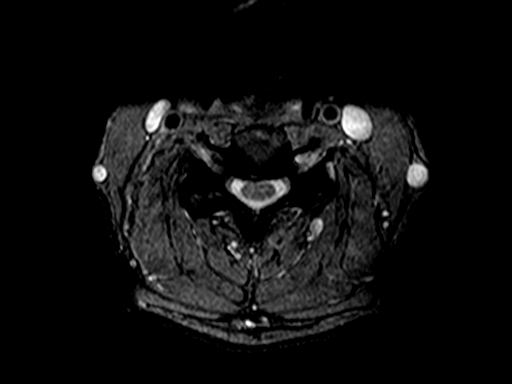
[im 13/19]
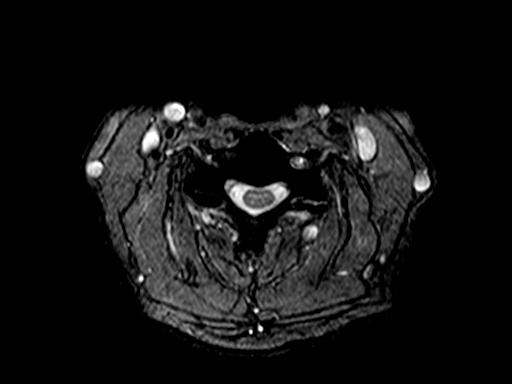
[im 16/19]
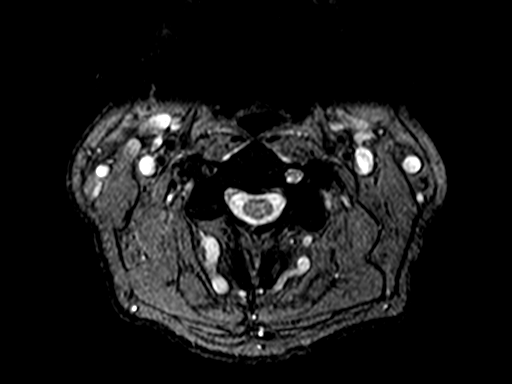
[im 19/19]
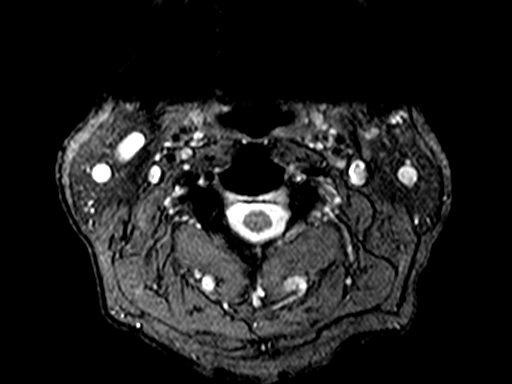

[Series 6: T1 · axial · 3.0mm · 0.78mm/px · z∈[-17,+73]mm · 13 of 22 slices shown (2 of 2)]
[im 1/22]
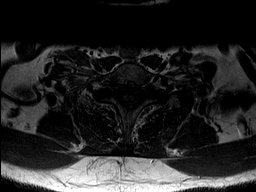
[im 2/22]
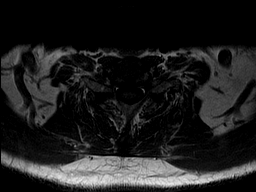
[im 3/22]
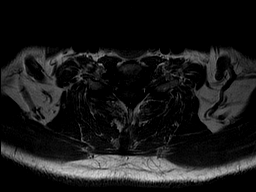
[im 5/22]
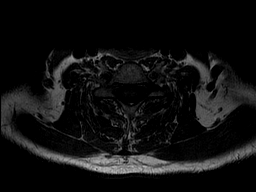
[im 6/22]
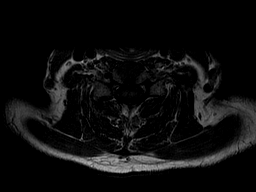
[im 8/22]
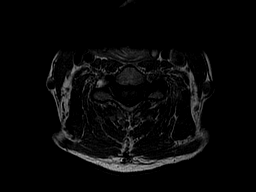
[im 9/22]
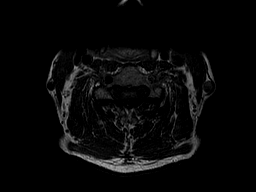
[im 10/22]
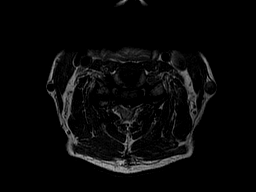
[im 12/22]
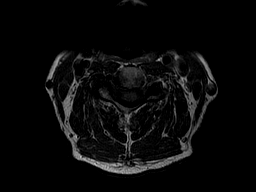
[im 13/22]
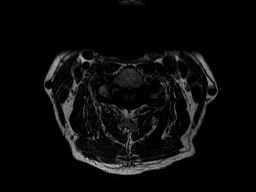
[im 16/22]
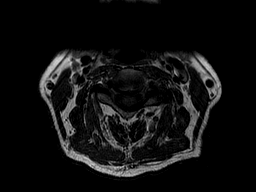
[im 19/22]
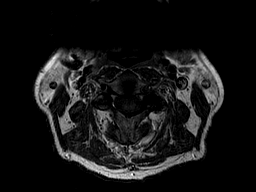
[im 22/22]
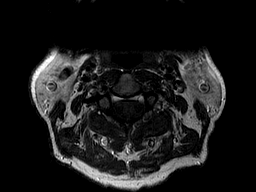

[39 of 48 positions shown; findings below may reference images not displayed]

EXAM

MRI cervical spine without contrast

INDICATION

Cervical Neck pain, radiating pain
HX SPINAL STENOSIS WITH LEFT ARM NUMBNESS AND TINGLING TO HANDS.  RG

FINDINGS

Sagittal T1, T2 and stir and axial T1 and T2 weighted images of the cervical spine were obtained.

Cervical vertebral body heights and alignment are normal.

The cervical portion of the spinal cord appears normal.

At C2-3 the, there is no disc herniation or nerve root abnormality.

At C3-4, there is no disc herniation or nerve root abnormality.

At C4-5, there are small osteophytes of vertebral body endplates. There is no disc herniation the
neural foramina appear normal.

At C5-6, there is a small central disc bulge. There is no disc herniation or nerve root
abnormality.

At C6-7, there is disc space narrowing. There are osteophytes vertebral body endplates. There mild
bilateral neural foraminal stenosis due to osteophytes.

Axial images were not obtained through the C7-T1 level. However no abnormality is seen on the
sagittal images at this level.

IMPRESSION

There is mild multilevel cervical spine spondylosis as described. There is no acute bone lesion.
There is no disc herniation or nerve root abnormality. There is normal appearance of the cervical
portion the spinal cord.

Tech Notes:

HX SPINAL STENOSIS WITH LEFT ARM NUMBNESS AND TINGLING TO HANDS.  RG

## 2023-09-13 ENCOUNTER — Encounter: Admit: 2023-09-13 | Discharge: 2023-09-13 | Payer: TRICARE (CHAMPUS)

## 2024-05-14 ENCOUNTER — Encounter: Admit: 2024-05-14 | Discharge: 2024-05-14 | Payer: TRICARE (CHAMPUS)

## 2024-05-21 ENCOUNTER — Encounter: Admit: 2024-05-21 | Discharge: 2024-05-21 | Payer: TRICARE (CHAMPUS)

## 2024-05-23 ENCOUNTER — Encounter: Admit: 2024-05-23 | Discharge: 2024-05-23 | Payer: TRICARE (CHAMPUS)

## 2024-05-23 NOTE — Telephone Encounter
 05/23/24 Records from [facility]/Dr. Gemma, previously saved to the consolidated folder, have been scanned to chart. CRM

## 2024-05-26 ENCOUNTER — Encounter: Admit: 2024-05-26 | Discharge: 2024-05-26 | Payer: TRICARE (CHAMPUS)

## 2024-05-26 NOTE — Telephone Encounter
 05/26/2024 Records request faxed per WQ task below. sdc    Please request additional records from PCP,  Dr. Bernardino Ned - Ph: [phone]; Fax: [phone]. No previous cardiac care.

## 2024-06-03 ENCOUNTER — Encounter: Admit: 2024-06-03 | Discharge: 2024-06-03 | Payer: MEDICARE

## 2024-06-05 ENCOUNTER — Encounter: Admit: 2024-06-05 | Discharge: 2024-06-05 | Payer: MEDICARE

## 2024-06-05 DIAGNOSIS — R001 Bradycardia, unspecified: Secondary | ICD-10-CM

## 2024-06-05 DIAGNOSIS — I10 Essential (primary) hypertension: Principal | ICD-10-CM

## 2024-06-05 DIAGNOSIS — E7800 Pure hypercholesterolemia, unspecified: Secondary | ICD-10-CM

## 2024-06-05 NOTE — Progress Notes [1]
 CARDIAC NEW PATIENT PROFILE        PHYSICIANS INFORMATION   REFERRING PHYSICIAN: Bernardino Ned, MD    PCP: Bernardino Ned, MD     REASON FOR VISIT/DIAGNOSIS: Bradycardia    RECENT EVENTS/SYMPTOMS: Patient saw Bernardino Ned, MD on 05/05/24 in follow up. Per provider note EKG showed sinus bradycardia and bigeminy. Referral was placed to Cardiology. Patient wore a cardiac monitor for 3 days. See report below.  Outside Family Medicine (05/05/2024)     06/05/24 1505: Called patient, verified name and date of birth. 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 Patient denies any associated symptoms. Reviewed health history with patient. Denies any questions or concerns. Reminded patient of date, time and location of appointment.     PERTINENT CARDIAC HISTORY: Hypertension, Murmur, High cholesterol, Bradycardia    MOST RECENT PERTINENT TESTING/PROCEDURES:   Date/  Facility Test/  Procedure Results Summary  (Please reference original report for full results)   05/14/2024 at [facility] Heart monitor Predominant rhythm: NSR. Atrial tachycardia (AT) 5 episodes, longest 14 beats @ Avg 136 bpm up to 155 bpm, fastest 4 beats @ Avg 143 bpm up to 155 bpm. Nonsustained ventricular tachycardia (VT) 2 episodes, Longest/Fastest 8 beats @ Avg 129 bpm up to 142 bpm. PAC 0.1%. Frequent PVC. PVC 8%.    Report Link: Outside Cardiac EP/HRM Studies (05/14/2024)   Report location: Outside records     12/19/2022 at [facility] Echo Normal LV systolic function. No color flow or Doppler evidence of hemodynamically significant valvular dysfunction. Agitated saline bubble contrast study is negative for right to left shunt.     Report Link: Unable to book mark  Report location: Media Tab       PERTINENT SURGERIES: None noted per records.     PERTINENT FAMILY HISTORY:   Mother - Hypertension, Carotid disease  Father - TIA, Hypertension  Brother - Heart attack    OTHER MEDICAL HISTORY: BPH, DDD, Joint pain, Spinal stenosis, Cerebral microvascular disease, OSA    MEDICATIONS/ALLERGIES: Updated per office visit note on 05/05/24 with Bernardino Ned, MD.     RECORDS/FILMS: No records to request at this time.     LAB: Recent lab work on 05/02/24. See book marks.

## 2024-06-09 ENCOUNTER — Encounter: Admit: 2024-06-09 | Discharge: 2024-06-09 | Payer: MEDICARE

## 2024-06-10 ENCOUNTER — Encounter: Admit: 2024-06-10 | Discharge: 2024-06-10 | Payer: MEDICARE

## 2024-06-10 VITALS — BP 130/93 | HR 60 | Ht 74.0 in | Wt 208.6 lb

## 2024-06-10 DIAGNOSIS — R7989 Other specified abnormal findings of blood chemistry: Secondary | ICD-10-CM

## 2024-06-10 DIAGNOSIS — E7800 Pure hypercholesterolemia, unspecified: Secondary | ICD-10-CM

## 2024-06-10 DIAGNOSIS — I10 Essential (primary) hypertension: Secondary | ICD-10-CM

## 2024-06-10 DIAGNOSIS — Z136 Encounter for screening for cardiovascular disorders: Principal | ICD-10-CM

## 2024-06-10 DIAGNOSIS — I493 Ventricular premature depolarization: Secondary | ICD-10-CM

## 2024-06-10 DIAGNOSIS — G4733 Obstructive sleep apnea (adult) (pediatric): Secondary | ICD-10-CM

## 2024-06-10 NOTE — Assessment & Plan Note [38]
 His iron numbers aren't terribly impressive with ferritin of 462.64, serum iron 107, TIBC 277.  This lab was done in late September.    He has follow-up with Dr. Charlanne in a month.  Depending on whether a diagnosis of hemachromatosis is established we may need to do further cardiac testing to evaluate for cardiac involvement.

## 2024-06-10 NOTE — Progress Notes [1]
 Date of Service: 06/10/2024    Travis Blackburn is a 68 y.o. male.       HPI     Travis Blackburn was in the [facility] for consultation regarding PVCs and bradycardia.  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  This is his first visit to a cardiologist.    He was in Dr. Debby' office recently and his initial vitals check included a HR measurement in the 30s.  An EKG showed bigeminal, unifocal PVC couplets.  A 3-day Holter was done in mid-October.  It showed SR average 74, range 47-137.  There was a 8% PVC burden and 10% of the PVCs were in couplets.  None of this ectopy is associated with palpitation symptoms.    Pleas denies syncope or near syncope.  He says that the day they told him his heart rate was low he felt a little out of sorts.  He did have an abnormal sleep study a couple of years ago and has daytime hypersomnolence and afternoon fatigue, but these symptoms don't seem related to any arrhythmia.    He's had some increased difficulty with postural hypotension over the past couple of years.  He also has a diagnosis of benign positional vertigo.    Additionally, he has cervical stenosis with episodes of bilateral arm numbness.  He was treated with epidural injections with good results by the [facility], but was last treated there in 2021.  He's now working with Physical Therapy.    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  He never gets headache afterwards.  He's currently working with an optomitrist on this issue.    Additionally, he just saw Dr. Charlanne, a [facility] hematologist, yesterday regarding high serum iron levels.  He has some elevation in LFTs and is abstaining from all alcohol for the next month for follow-up lab work.           Vitals:    06/10/24 1043 06/10/24 1058 06/10/24 1059 06/10/24 1100   BP: (!) 130/93 (!) 135/97 135/90 (!) 145/82   BP Source: Arm, Left Upper Arm, Right Upper Arm, Right Upper Arm, Right Upper   Pulse: 60 55 64 53   SpO2: 97%      O2 Device: None (Room air)      PainSc: Two      Weight: 94.6 kg (208 lb 9.6 oz)      Height: 188 cm (6' 2)        Body mass index is 26.78 kg/m?SABRA     Past Medical History  Patient Active Problem List    Diagnosis Date Noted    Obstructive sleep apnea 06/10/2024     08/2021 sleep study-mild; opting for dental device rather than auto-PAP      Prehypertension 06/10/2024    High serum ferritin 06/10/2024    Essential hypertension 06/05/2024     per office visit note on 05/05/24 with Bernardino Debby, MD.     12/19/22 Echo: Normal LV systolic function. No color flow or Doppler evidence of hemodynamically significant valvular dysfunction. Agitated saline bubble contrast study is negative for right to left shunt.       High cholesterol 06/05/2024     per office visit note on 05/05/24 with Bernardino Debby, MD.       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     05/14/24 Heart monitor: Predominant rhythm: NSR. Atrial tachycardia (AT) 5 episodes, longest 14 beats @ Avg 136 bpm up to 155 bpm, fastest 4 beats @ Avg 143 bpm up to 155 bpm. Nonsustained ventricular tachycardia (VT) 2 episodes, Longest/Fastest 8 beats @ Avg 129 bpm up to 142 bpm. PAC 0.1%. Frequent PVC. PVC 8%.      BPH associated with nocturia     Elevated prostate specific antigen (PSA)     Cervical stenosis of spine 03/14/2018    Cervical radiculopathy 03/14/2018         Review of Systems   Constitutional: Positive for malaise/fatigue.   HENT: Negative.     Eyes: Negative.    Cardiovascular:  Positive for irregular heartbeat, leg swelling and near-syncope.   Respiratory: Negative.     Endocrine: Negative.    Hematologic/Lymphatic: Negative.    Skin: Negative.    Musculoskeletal:  Positive for back pain, joint pain and neck pain.   Gastrointestinal: Negative.    Genitourinary: Negative.    Neurological:  Positive for dizziness, light-headedness and vertigo.   Psychiatric/Behavioral: Negative.     Allergic/Immunologic: Negative.        Physical Exam    General Appearance: no distress   Skin: warm, no ulcers or xanthomas   Digits and Nails: no cyanosis or clubbing   Eyes: conjunctivae and lids normal, pupils are equal and round   Teeth/Gums/Palate: dentition unremarkable, no lesions   Lips & Oral Mucosa: no pallor or cyanosis   Neck Veins: normal JVP , neck veins are not distended   Thyroid: no nodules, masses, tenderness or enlargement   Chest Inspection: chest is normal in appearance   Respiratory Effort: breathing comfortably, no respiratory distress   Auscultation/Percussion: lungs clear to auscultation, no rales or rhonchi, no wheezing   PMI: PMI not enlarged or displaced   Cardiac Rhythm: regular rhythm and normal rate   Cardiac Auscultation: S1, S2 normal, no rub, no gallop   Murmurs: no murmur   Peripheral Circulation: normal peripheral circulation   Carotid Arteries: normal carotid upstroke bilaterally, no bruits   Radial Arteries: normal symmetric radial pulses   Abdominal Aorta: no abdominal aortic bruit   Pedal Pulses: normal symmetric pedal pulses   Lower Extremity Edema: no lower extremity edema   Abdominal Exam: soft, non-tender, no masses, bowel sounds normal   Liver & Spleen: no organomegaly   Gait & Station: walks without assistance   Muscle Strength: normal muscle tone   Orientation: oriented to time, place and person   Affect & Mood: appropriate and sustained affect   Language and Memory: patient responsive and seems to comprehend information   Neurologic Exam: neurological assessment grossly intact   Other: moves all extremities      Cardiovascular Studies    EKG:  SR, rate 71.  One PAC.  3 beats of ventricular bigeminy (unifocal).    Cardiovascular Health Factors  Vitals BP Readings from Last 3 Encounters:   06/10/24 (!) 145/82   09/02/19 (!) 185/109   01/24/19 (!) 157/93     Wt Readings from Last 3 Encounters:   06/10/24 94.6 kg (208 lb 9.6 oz)   09/02/19 93 kg (205 lb)   01/24/19 93 kg (205 lb)     BMI Readings from Last 3 Encounters:   06/10/24 26.78 kg/m?   09/02/19 29.41 kg/m?   01/24/19 26.32 kg/m?      Smoking Tobacco Use History[1]   Lipid Profile  Cholesterol   Date Value Ref Range Status   05/02/2024 150  Final     HDL   Date Value Ref Range Status   05/02/2024 30 (L) >=40 Final     LDL   Date Value Ref Range Status   05/02/2024 87  Final     Triglycerides   Date Value Ref Range Status   05/02/2024 167 (H) <150 Final      Blood Sugar No results found for: HGBA1C  Glucose   Date Value Ref Range Status   01/24/2024 103  Final          Problems Addressed Today  Encounter Diagnoses   Name Primary?    Screening for heart disease Yes    Obstructive sleep apnea     Frequent unifocal PVCs     Essential hypertension     High cholesterol     High serum ferritin        Assessment and Plan       Obstructive sleep apnea  Mildly abnormal sleep study in January, 2023.  Discussion about mandibular advancement device.  I think he has symptoms and suggested that he check back with Dr. Debby about treatment.    Essential hypertension  Olmesartan 5 mg/day.    High cholesterol  Lab Results   Component Value Date    CHOL 150 05/02/2024    TRIG 167 (H) 05/02/2024    HDL 30 (L) 05/02/2024    LDL 87 05/02/2024    VLDL 33 05/02/2024    CHOLHDLC 5 05/02/2024      Rosuva 20 mg/day.    Frequent unifocal PVCs  8% PVC burden, unifocal with about 10% of the PVCs as couplets.  This 3-day Holter was done in mid-October.  His ectopy isn't symptomatic.    I think that the perception about bradycardia from the recent office visit relates to an error in HR measurement by radial pulse or sat monitor.  I don't think the PVCs were actually being counted.    I ordered an updated echocardiogram for now.  I don't think he needs stress testing.     High serum ferritin  His iron numbers aren't terribly impressive with ferritin of 462.64, serum iron 107, TIBC 277.  This lab was done in late September.    He has follow-up with Dr. Charlanne in a month.  Depending on whether a diagnosis of hemachromatosis is established we may need to do further cardiac testing to evaluate for cardiac involvement.      Current Medications (including today's revisions)   acetaminophen (TYLENOL) 325 mg tablet Take one tablet to two tablets by mouth daily as needed.    celecoxib (CELEBREX) 200 mg capsule Take one capsule by mouth daily.    cephalexin (KEFLEX) 500 mg capsule Take four capsules by mouth as directed. BEFORE DENTAL PROCEDURES    fluticasone propionate (FLONASE) 50 mcg/actuation nasal spray Apply one spray to each nostril as directed every morning.    olmesartan (BENICAR) 5 mg tablet Take one tablet by mouth daily.    pseudoephedrine hcl (SUDAFED) 30 mg tablet Take two tablets by mouth every 4 hours as needed for Congestion.    rosuvastatin (CRESTOR) 20 mg tablet Take one tablet by mouth daily.     Total time spent on today's office visit was 60 minutes.  This includes face-to-face in person visit with patient as well as nonface-to-face time including review of the EMR, outside records, labs, radiologic studies, echocardiogram & other cardiovascular studies,  formation of treatment plan, after visit summary, future disposition, and lastly on documentation.              [1]   Social History  Tobacco Use   Smoking Status Some Days    Current packs/day: 0.25    Average packs/day: 0.3 packs/day for 15.0 years (3.8 ttl pk-yrs)    Types: Cigars, Cigarettes   Smokeless Tobacco Former   Tobacco Comments    Occasional cigar

## 2024-06-10 NOTE — Assessment & Plan Note [38]
 Olmesartan 5 mg/day.

## 2024-06-10 NOTE — Assessment & Plan Note [38]
 Lab Results   Component Value Date    CHOL 150 05/02/2024    TRIG 167 (H) 05/02/2024    HDL 30 (L) 05/02/2024    LDL 87 05/02/2024    VLDL 33 05/02/2024    CHOLHDLC 5 05/02/2024      Rosuva 20 mg/day.

## 2024-07-15 ENCOUNTER — Encounter: Admit: 2024-07-15 | Discharge: 2024-07-15 | Payer: MEDICARE

## 2024-07-22 ENCOUNTER — Encounter: Admit: 2024-07-22 | Discharge: 2024-07-22 | Payer: MEDICARE

## 2024-07-22 ENCOUNTER — Ambulatory Visit: Admit: 2024-07-22 | Discharge: 2024-07-22 | Payer: MEDICARE

## 2024-07-24 ENCOUNTER — Encounter: Admit: 2024-07-24 | Discharge: 2024-07-24 | Payer: MEDICARE

## 2024-07-24 NOTE — Telephone Encounter [36]
-----   Message from GORMAN Balloon, MD sent at 07/24/2024  2:08 PM CST -----  [facility], can you please let Sharmarke know that his echo looks entirely normal.  Can you also ask him to check back with us  if Dr. Charlanne, the [facility] hematologist, thinks that he has hemochromatosis?    Thanks.    Cc:  Dr. Debby  ----- Message -----  From: Karalee Donnice BROCKS, MD  Sent: 07/22/2024   1:09 PM CST  To: Elspeth JONETTA Balloon, MD

## 2024-07-24 NOTE — Telephone Encounter [36]
 Results and recommendations called to patient. Patient states that his hematologist ruled out hemochromatosis. He has follow up with him in a few months.
